# Patient Record
Sex: Male | Born: 1935 | Race: White | Hispanic: No | Marital: Married
Health system: Southern US, Community
[De-identification: ages and names within clinical notes are randomized; demographics above are authoritative.]

---

## 2013-12-29 ENCOUNTER — Ambulatory Visit (HOSPITAL_COMMUNITY)
Admission: RE | Admit: 2013-12-29 | Discharge: 2013-12-29 | Disposition: A | Discharge status: Home / Self Care | Payer: Medicare Other | Source: Ambulatory Visit | Attending: Urology | Admitting: Urology

## 2013-12-29 ENCOUNTER — Other Ambulatory Visit (HOSPITAL_COMMUNITY): Payer: Self-pay | Admitting: Urology

## 2013-12-29 DIAGNOSIS — N289 Disorder of kidney and ureter, unspecified: Secondary | ICD-10-CM

## 2013-12-29 DIAGNOSIS — Z01818 Encounter for other preprocedural examination: Secondary | ICD-10-CM | POA: Insufficient documentation

## 2014-01-01 ENCOUNTER — Other Ambulatory Visit (HOSPITAL_COMMUNITY): Payer: Self-pay | Admitting: Urology

## 2014-01-01 DIAGNOSIS — N2889 Other specified disorders of kidney and ureter: Secondary | ICD-10-CM

## 2014-01-14 ENCOUNTER — Ambulatory Visit (HOSPITAL_COMMUNITY)
Admission: RE | Admit: 2014-01-14 | Discharge: 2014-01-14 | Disposition: A | Discharge status: Home / Self Care | Payer: Medicare Other | Source: Ambulatory Visit | Attending: Urology | Admitting: Urology

## 2014-01-14 DIAGNOSIS — K7689 Other specified diseases of liver: Secondary | ICD-10-CM | POA: Diagnosis not present

## 2014-01-14 DIAGNOSIS — N289 Disorder of kidney and ureter, unspecified: Secondary | ICD-10-CM | POA: Diagnosis present

## 2014-01-14 DIAGNOSIS — N281 Cyst of kidney, acquired: Secondary | ICD-10-CM | POA: Insufficient documentation

## 2014-01-14 DIAGNOSIS — N2889 Other specified disorders of kidney and ureter: Secondary | ICD-10-CM

## 2014-01-14 MED ORDER — GADOBENATE DIMEGLUMINE 529 MG/ML IV SOLN
14.0000 mL | Freq: Once | INTRAVENOUS | Status: AC | PRN
  Administered 2014-01-14: 14 mL via INTRAVENOUS

## 2014-07-19 ENCOUNTER — Other Ambulatory Visit (HOSPITAL_COMMUNITY): Payer: Self-pay | Admitting: Urology

## 2014-07-19 DIAGNOSIS — N2889 Other specified disorders of kidney and ureter: Secondary | ICD-10-CM

## 2014-07-30 ENCOUNTER — Ambulatory Visit (HOSPITAL_COMMUNITY)
Admission: RE | Admit: 2014-07-30 | Discharge: 2014-07-30 | Disposition: A | Discharge status: Home / Self Care | Payer: Medicare Other | Source: Ambulatory Visit | Attending: Urology | Admitting: Urology

## 2014-07-30 DIAGNOSIS — N2889 Other specified disorders of kidney and ureter: Secondary | ICD-10-CM | POA: Diagnosis present

## 2014-07-30 LAB — POCT I-STAT CREATININE: Creatinine, Ser: 1.4 mg/dL — ABNORMAL HIGH (ref 0.50–1.35)

## 2014-07-30 MED ORDER — GADOBENATE DIMEGLUMINE 529 MG/ML IV SOLN
15.0000 mL | Freq: Once | INTRAVENOUS | Status: AC | PRN
  Administered 2014-07-30: 13 mL via INTRAVENOUS

## 2014-12-13 ENCOUNTER — Other Ambulatory Visit (HOSPITAL_COMMUNITY): Payer: Self-pay | Admitting: Urology

## 2014-12-13 DIAGNOSIS — N2889 Other specified disorders of kidney and ureter: Secondary | ICD-10-CM

## 2015-01-03 ENCOUNTER — Ambulatory Visit (HOSPITAL_COMMUNITY)
Admission: RE | Admit: 2015-01-03 | Discharge: 2015-01-03 | Disposition: A | Discharge status: Home / Self Care | Payer: Medicare Other | Source: Ambulatory Visit | Attending: Urology | Admitting: Urology

## 2015-01-03 DIAGNOSIS — N2889 Other specified disorders of kidney and ureter: Secondary | ICD-10-CM

## 2015-01-03 DIAGNOSIS — N281 Cyst of kidney, acquired: Secondary | ICD-10-CM | POA: Diagnosis not present

## 2015-01-03 LAB — POCT I-STAT CREATININE: CREATININE: 1.4 mg/dL — AB (ref 0.61–1.24)

## 2015-01-03 MED ORDER — GADOBENATE DIMEGLUMINE 529 MG/ML IV SOLN
15.0000 mL | Freq: Once | INTRAVENOUS | Status: AC | PRN
  Administered 2015-01-03: 14 mL via INTRAVENOUS

## 2015-07-05 ENCOUNTER — Other Ambulatory Visit (HOSPITAL_COMMUNITY): Payer: Self-pay | Admitting: Urology

## 2015-07-05 DIAGNOSIS — D49511 Neoplasm of unspecified behavior of right kidney: Secondary | ICD-10-CM

## 2015-07-21 ENCOUNTER — Ambulatory Visit (HOSPITAL_COMMUNITY)
Admission: RE | Admit: 2015-07-21 | Discharge: 2015-07-21 | Disposition: A | Discharge status: Home / Self Care | Payer: Medicare Other | Source: Ambulatory Visit | Attending: Urology | Admitting: Urology

## 2015-07-21 DIAGNOSIS — R1030 Lower abdominal pain, unspecified: Secondary | ICD-10-CM | POA: Insufficient documentation

## 2015-07-21 DIAGNOSIS — D49511 Neoplasm of unspecified behavior of right kidney: Secondary | ICD-10-CM | POA: Insufficient documentation

## 2015-07-21 LAB — POCT I-STAT CREATININE: CREATININE: 1.2 mg/dL (ref 0.61–1.24)

## 2015-07-21 MED ORDER — GADOBENATE DIMEGLUMINE 529 MG/ML IV SOLN
15.0000 mL | Freq: Once | INTRAVENOUS | Status: AC | PRN
  Administered 2015-07-21: 14 mL via INTRAVENOUS

## 2016-01-02 ENCOUNTER — Other Ambulatory Visit (HOSPITAL_COMMUNITY): Payer: Self-pay | Admitting: Urology

## 2016-01-02 DIAGNOSIS — D49511 Neoplasm of unspecified behavior of right kidney: Secondary | ICD-10-CM

## 2016-02-28 ENCOUNTER — Ambulatory Visit (HOSPITAL_COMMUNITY)
Admission: RE | Admit: 2016-02-28 | Discharge: 2016-02-28 | Disposition: A | Discharge status: Home / Self Care | Payer: Medicare Other | Source: Ambulatory Visit | Attending: Urology | Admitting: Urology

## 2016-02-28 DIAGNOSIS — D49511 Neoplasm of unspecified behavior of right kidney: Secondary | ICD-10-CM | POA: Insufficient documentation

## 2016-02-28 DIAGNOSIS — N289 Disorder of kidney and ureter, unspecified: Secondary | ICD-10-CM | POA: Diagnosis not present

## 2016-02-28 LAB — POCT I-STAT CREATININE: Creatinine, Ser: 1.6 mg/dL — ABNORMAL HIGH (ref 0.61–1.24)

## 2016-02-28 MED ORDER — GADOBENATE DIMEGLUMINE 529 MG/ML IV SOLN
10.0000 mL | Freq: Once | INTRAVENOUS | Status: AC | PRN
  Administered 2016-02-28: 7 mL via INTRAVENOUS

## 2016-12-28 ENCOUNTER — Other Ambulatory Visit (HOSPITAL_COMMUNITY): Payer: Self-pay | Admitting: Urology

## 2016-12-28 DIAGNOSIS — D49511 Neoplasm of unspecified behavior of right kidney: Secondary | ICD-10-CM

## 2017-03-15 ENCOUNTER — Ambulatory Visit (HOSPITAL_COMMUNITY)
Admission: RE | Admit: 2017-03-15 | Discharge: 2017-03-15 | Disposition: A | Discharge status: Home / Self Care | Payer: Medicare Other | Source: Ambulatory Visit | Attending: Urology | Admitting: Urology

## 2017-03-15 DIAGNOSIS — D49511 Neoplasm of unspecified behavior of right kidney: Secondary | ICD-10-CM | POA: Insufficient documentation

## 2017-03-15 LAB — POCT I-STAT CREATININE: Creatinine, Ser: 1.5 mg/dL — ABNORMAL HIGH (ref 0.61–1.24)

## 2017-03-15 MED ORDER — GADOBENATE DIMEGLUMINE 529 MG/ML IV SOLN
20.0000 mL | Freq: Once | INTRAVENOUS | Status: AC | PRN
  Administered 2017-03-15: 13 mL via INTRAVENOUS

## 2017-08-11 IMAGING — MR MR ABDOMEN WO/W CM
9 of 18 series · 20 of 48 positions shown · IV contrast (multihance)
Comparison: MRI 07/21/2015 and 01/14/2014

CLINICAL DATA: Followup right renal lesion.

EXAM:
MRI ABDOMEN WITHOUT AND WITH CONTRAST
TECHNIQUE: Multiplanar multisequence MR imaging of the abdomen was performed
both before and after the administration of intravenous contrast.
CONTRAST:  7mL MULTIHANCE GADOBENATE DIMEGLUMINE 529 MG/ML IV SOLN

[Series 3: DWI b500 · axial · 6.0mm · 1.48mm/px · z∈[-159,+138]mm · 2 of 77 slices shown]
[im 1/77]
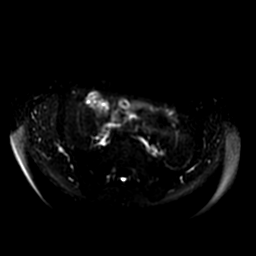
[im 77/77]
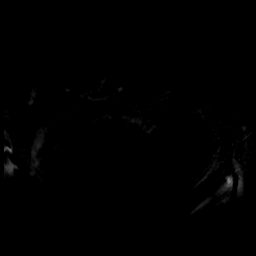

[Series 4: T2 fat-sat · axial · 5.0mm · 0.78mm/px · z∈[-131,+119]mm · 2 of 51 slices shown]
[im 1/51]
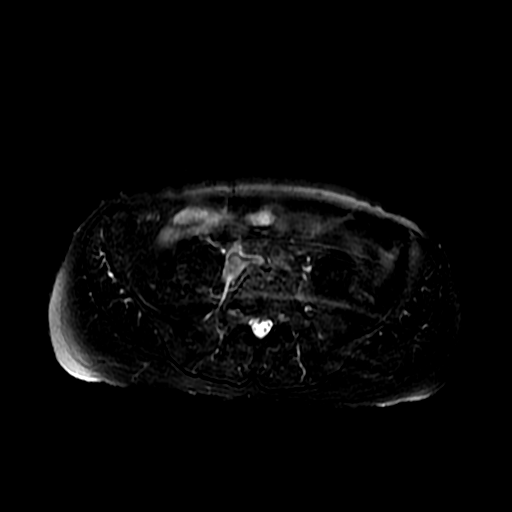
[im 51/51]
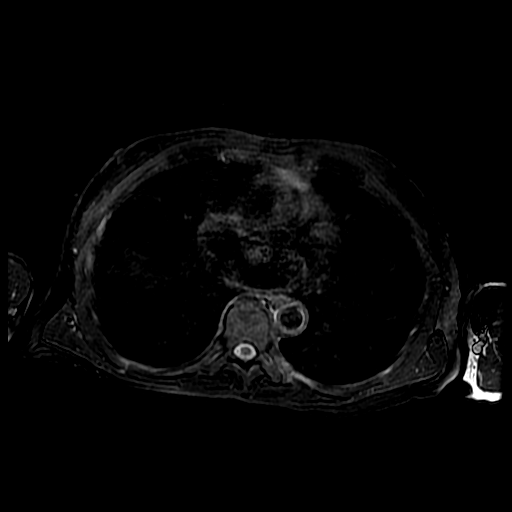

[Series 5: ax dualecho · axial · 5.0mm · 0.78mm/px · z∈[-131,+119]mm · 4 of 102 slices shown]
[im 1/102]
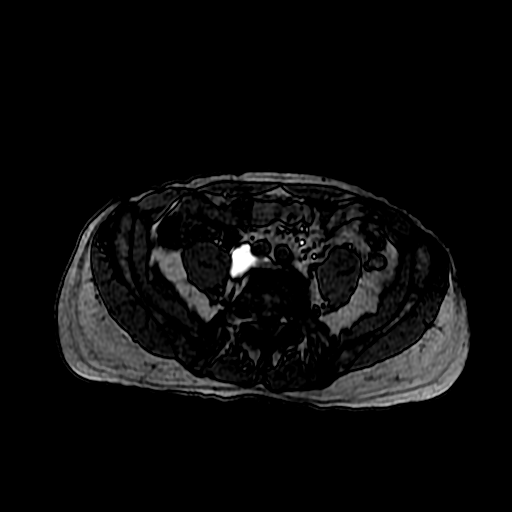
[im 34/102]
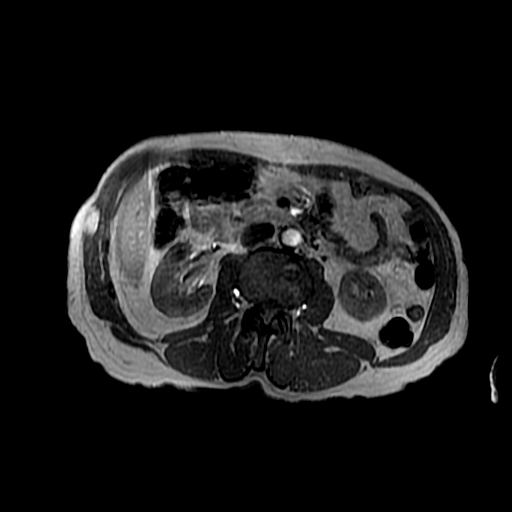
[im 68/102]
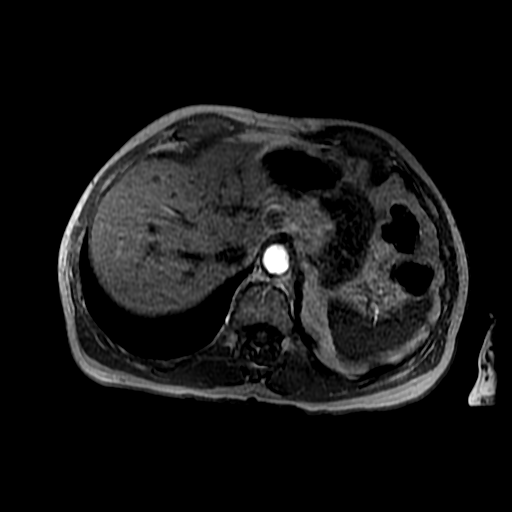
[im 102/102]
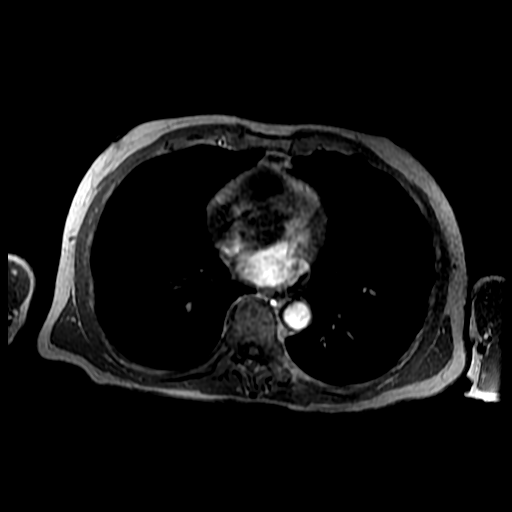

[Series 6: T2 · axial · 5.0mm · 0.78mm/px · z∈[-131,+119]mm · 2 of 50 slices shown (1 of 2)]
[im 1/50]
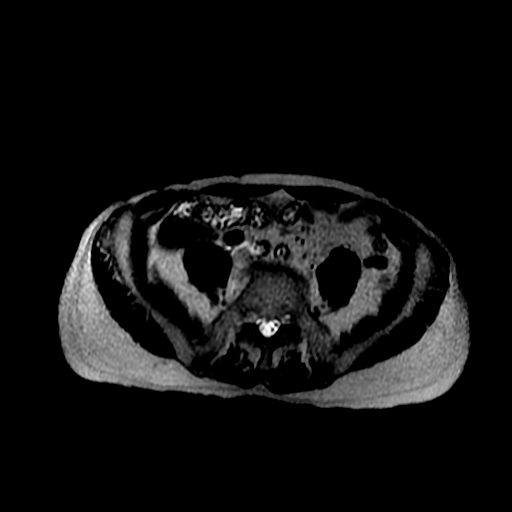
[im 50/50]
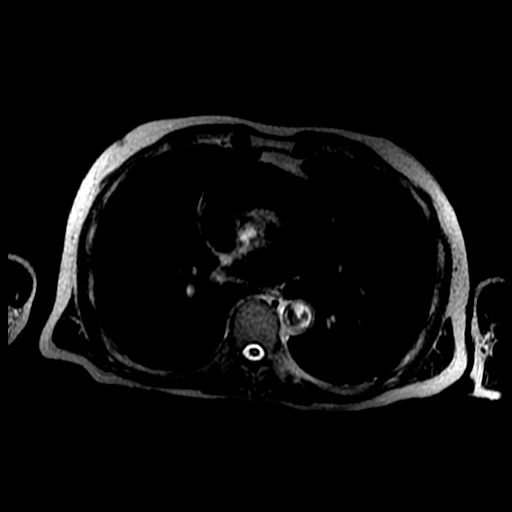

[Series 7: T2 · coronal · 5.0mm · 0.78mm/px · 2 of 44 slices shown (2 of 2)]
[im 1/44]
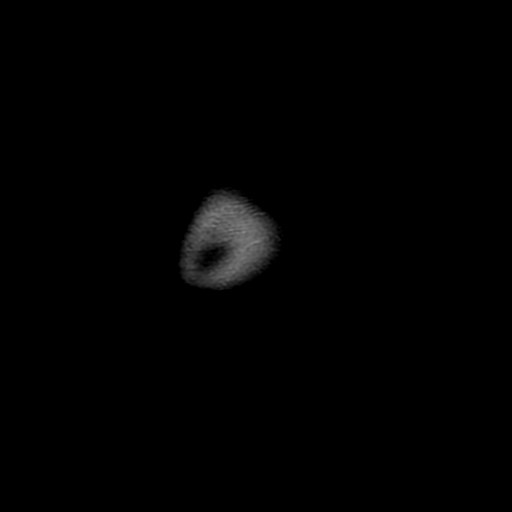
[im 44/44]
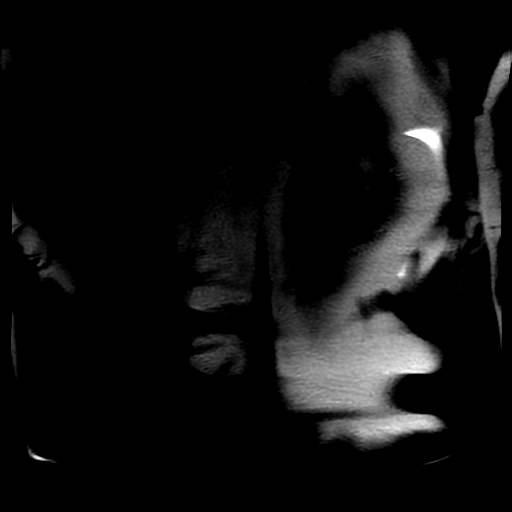

[Series 8: bSSFP · axial · 5.0mm · 0.78mm/px · z∈[-131,+119]mm · 2 of 51 slices shown]
[im 1/51]
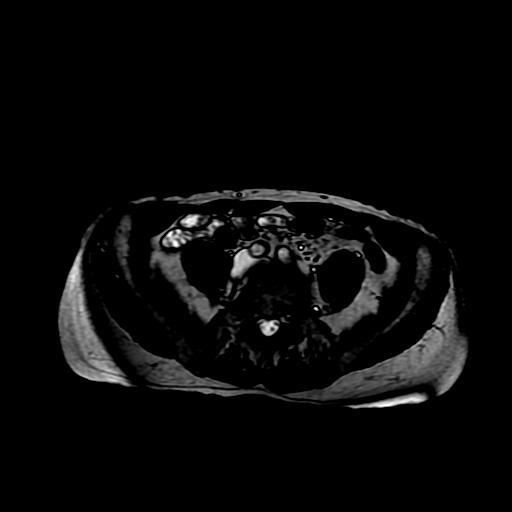
[im 51/51]
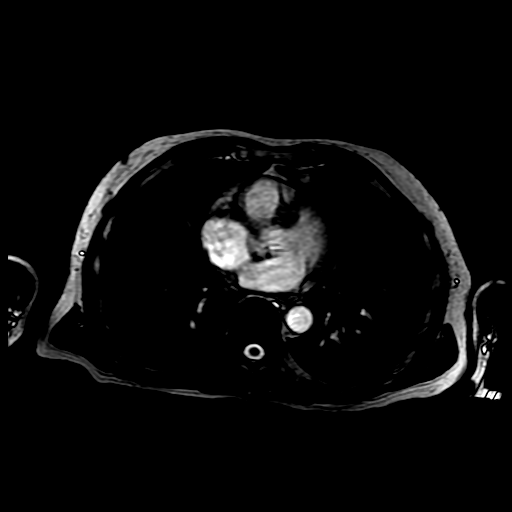

[Series 300: DWI · axial · 6.0mm · 1.48mm/px · 1 of 39 slices shown]
[im 1/39]
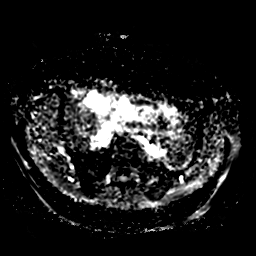

[Series 900: T1 dynamic · axial · 5.8mm · 0.78mm/px · z∈[-139,+113]mm · 3 of 88 slices shown (1 of 2)]
[im 1/88]
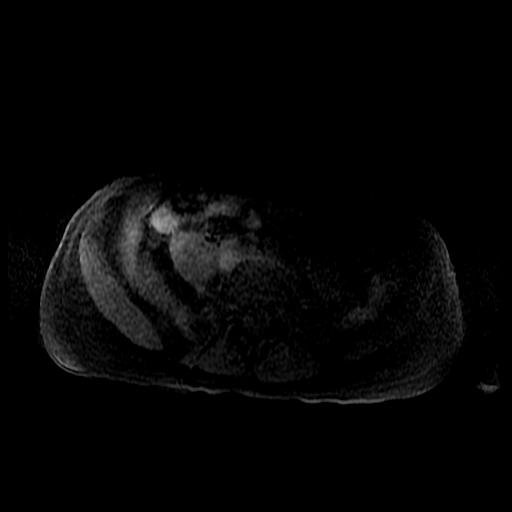
[im 44/88]
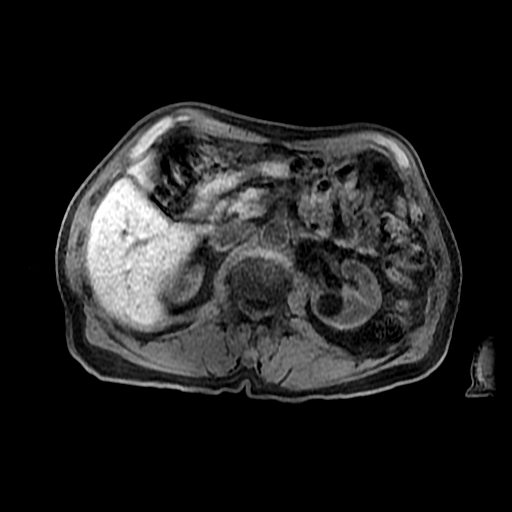
[im 88/88]
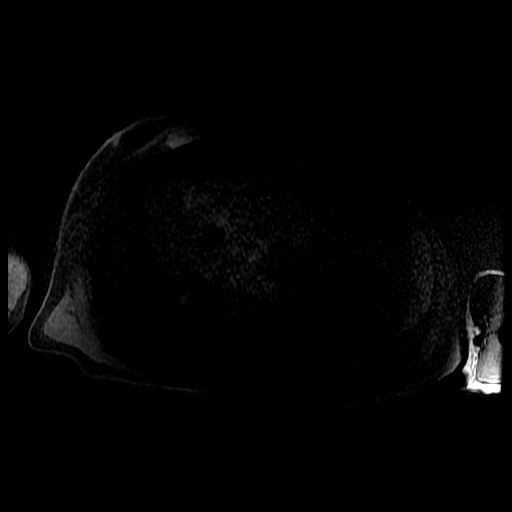

[Series 901: T1 dynamic · axial · 5.8mm · 0.78mm/px · z∈[-139,-14]mm · 2 of 88 slices shown (2 of 2)]
[im 1/88]
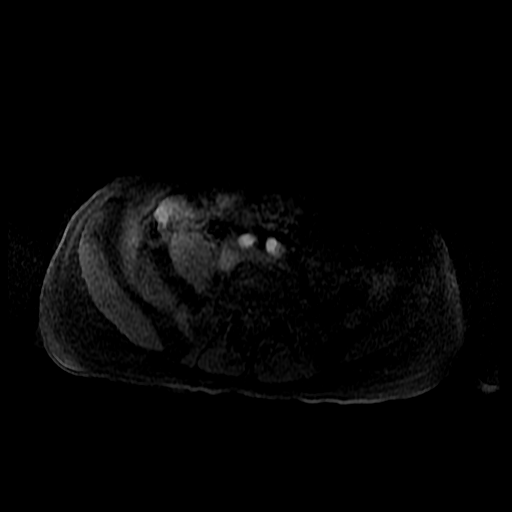
[im 44/88]
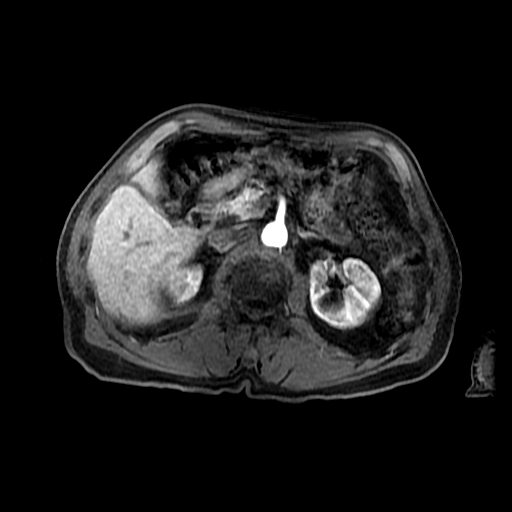

[20 of 48 positions shown; findings below may reference images not displayed]

FINDINGS: Lower chest: No pulmonary lesions are identified at the lung bases.
The heart is normal in size. No pleural or pericardial effusion.

Hepatobiliary: No focal hepatic lesions or intrahepatic biliary
dilatation. The gallbladder is surgically absent. No common bile
duct dilatation.

Pancreas:  No mass, inflammation or ductal dilatation.

Spleen:  Normal size.  No focal lesions.

Adrenals/Urinary Tract: The adrenal gland cell are unremarkable and
stable.

Stable simple bilateral renal cysts. Deep right lower pole renal
lesion medially is largely cystic but complicated by a few thin
septations. It measures approximately 22 x 22 mm. And 7223 measured
16 x 16 mm. There may be thin septal enhancement versus small
adjacent vessels. I would classify this as a Bosniak 2 F lesion.
Given its stability over time and the patient's age a followup MR in
2 years is suggested. If it remains stable at that time I do not
think any further imaging evaluation would be necessary.

Stomach/Bowel: The stomach, duodenum, visualized small bowel and
visualize colon are unremarkable.

Vascular/Lymphatic: The aorta and branch vessels are normal. The
major venous structures are patent. No mesenteric or retroperitoneal
mass or adenopathy.

Other: Stable benign-appearing lipoma in the oblique abdominal
muscles on the right side. No ascites or abdominal wall hernia.

Musculoskeletal: No significant bony findings.
IMPRESSION: 22 x 22 mm lower pole right renal lesion medially has enlarged
slightly since 7223 but is otherwise stable. Thin septal enhancement
versus adjacent vessels. I would classify this as a Bosniak 2 lesion
and recommend MRI follow-up in 2 years.
